# Patient Record
Sex: Male | Born: 2000 | Race: Black or African American | Hispanic: No | Marital: Single | Smoking: Never smoker
Health system: Southern US, Community
[De-identification: ages and names within clinical notes are randomized; demographics above are authoritative.]

## PROBLEM LIST (undated history)

## (undated) DIAGNOSIS — F332 Major depressive disorder, recurrent severe without psychotic features: Secondary | ICD-10-CM

## (undated) HISTORY — PX: FRACTURE SURGERY: SHX138

---
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
## 2022-03-01 ENCOUNTER — Emergency Department (HOSPITAL_COMMUNITY)
Admission: EM | Admit: 2022-03-01 | Discharge: 2022-03-02 | Disposition: A | Discharge status: Home / Self Care | Payer: Self-pay | Attending: Emergency Medicine | Admitting: Emergency Medicine

## 2022-03-01 ENCOUNTER — Emergency Department (HOSPITAL_COMMUNITY): Payer: Self-pay

## 2022-03-01 DIAGNOSIS — F19920 Other psychoactive substance use, unspecified with intoxication, uncomplicated: Secondary | ICD-10-CM

## 2022-03-01 DIAGNOSIS — F12920 Cannabis use, unspecified with intoxication, uncomplicated: Secondary | ICD-10-CM | POA: Insufficient documentation

## 2022-03-01 DIAGNOSIS — R569 Unspecified convulsions: Secondary | ICD-10-CM | POA: Insufficient documentation

## 2022-03-01 LAB — CBC WITH DIFFERENTIAL/PLATELET
Abs Immature Granulocytes: 0.04 10*3/uL (ref 0.00–0.07)
Basophils Absolute: 0.1 10*3/uL (ref 0.0–0.1)
Basophils Relative: 1 %
Eosinophils Absolute: 0.2 10*3/uL (ref 0.0–0.5)
Eosinophils Relative: 3 %
HCT: 42.2 % (ref 39.0–52.0)
Hemoglobin: 14.2 g/dL (ref 13.0–17.0)
Immature Granulocytes: 1 %
Lymphocytes Relative: 11 %
Lymphs Abs: 0.8 10*3/uL (ref 0.7–4.0)
MCH: 30 pg (ref 26.0–34.0)
MCHC: 33.6 g/dL (ref 30.0–36.0)
MCV: 89 fL (ref 80.0–100.0)
Monocytes Absolute: 0.5 10*3/uL (ref 0.1–1.0)
Monocytes Relative: 7 %
Neutro Abs: 5.8 10*3/uL (ref 1.7–7.7)
Neutrophils Relative %: 77 %
Platelets: 255 10*3/uL (ref 150–400)
RBC: 4.74 MIL/uL (ref 4.22–5.81)
RDW: 12.3 % (ref 11.5–15.5)
WBC: 7.4 10*3/uL (ref 4.0–10.5)
nRBC: 0 % (ref 0.0–0.2)

## 2022-03-01 LAB — COMPREHENSIVE METABOLIC PANEL
ALT: 12 U/L (ref 0–44)
AST: 17 U/L (ref 15–41)
Albumin: 3.9 g/dL (ref 3.5–5.0)
Alkaline Phosphatase: 56 U/L (ref 38–126)
Anion gap: 7 (ref 5–15)
BUN: 7 mg/dL (ref 6–20)
CO2: 24 mmol/L (ref 22–32)
Calcium: 8.6 mg/dL — ABNORMAL LOW (ref 8.9–10.3)
Chloride: 103 mmol/L (ref 98–111)
Creatinine, Ser: 1.06 mg/dL (ref 0.61–1.24)
GFR, Estimated: >60 mL/min (ref >60)
Glucose, Bld: 137 mg/dL — ABNORMAL HIGH (ref 70–99)
Potassium: 3.9 mmol/L (ref 3.5–5.1)
Sodium: 134 mmol/L — ABNORMAL LOW (ref 135–145)
Total Bilirubin: 0.6 mg/dL (ref 0.3–1.2)
Total Protein: 6.5 g/dL (ref 6.5–8.1)

## 2022-03-01 LAB — ETHANOL: Alcohol, Ethyl (B): <10 mg/dL (ref <10)

## 2022-03-01 LAB — MAGNESIUM: Magnesium: 2 mg/dL (ref 1.7–2.4)
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
## 2022-03-01 NOTE — ED Notes (Signed)
Patient transported to CT 

## 2022-03-01 NOTE — Discharge Instructions (Signed)
You were seen in the ER after being picked up by an ambulance for a possible seizure.  You told the ambulance crew that you had smoked or used K2, which is a type of street drug which can cause seizures.  It is extremely important to not you ever use this type of drug again.  This would include marijuana which can also trigger seizures.  Your CT scan of the brain your blood work was otherwise normal in the ER.  You should not drive a car or operate heavy machinery until you are seizure-free for 6 months.  If you have another seizure please call 911 and come back to the ER immediately.

## 2022-03-01 NOTE — ED Triage Notes (Signed)
Pt BIB [facility] for seizure/AMS.  Pt was witnessed behaving erratically. Pt was then found on ground shaking and foaming at mouth.  Seizure assumed but pt endorses having smoked K2 prior to event.  EMS states he has appeared postictal en route but cannot confirm seizure hx or whether one definitively happened tonight. Pt was not hypoxic on scene.  No obvious injuries.  Pt is lethargic but arousable on arrival.

## 2022-03-02 LAB — RAPID URINE DRUG SCREEN, HOSP PERFORMED
Amphetamines: NOT DETECTED
Barbiturates: NOT DETECTED
Benzodiazepines: NOT DETECTED
Cocaine: NOT DETECTED
Opiates: NOT DETECTED
Tetrahydrocannabinol: POSITIVE — AB

## 2022-03-02 NOTE — ED Notes (Addendum)
Pt's dad's contact number:  [phone]

## 2022-03-02 NOTE — ED Provider Notes (Signed)
Patient care assumed at 2330.  Patient here for evaluation following a seizure-like activity after using drugs.  Patient observed for several hours overnight and continued to metabolize.  Patient does not recall earlier events.  On assessment patient is awake, alert, not suicidal or homicidal.  He is calm and appropriate.  No focal neurologic deficits.  He is able to ambulate without difficulty.  Discussed with patient findings of studies.  Also contacted his father via phone.  Plan to discharge with outpatient follow-up.   Tilden Fossa, MD 03/02/22 [phone]

## 2022-03-02 NOTE — ED Notes (Signed)
Pt is alert and oriented x 4 Patient ambulated  Attempted to call father x2

## 2022-03-02 NOTE — ED Notes (Signed)
PT ambulated around room with steady gait

## 2022-03-28 ENCOUNTER — Encounter (HOSPITAL_COMMUNITY): Payer: Self-pay | Admitting: *Deleted

## 2022-03-28 ENCOUNTER — Ambulatory Visit (HOSPITAL_COMMUNITY)
Admission: EM | Admit: 2022-03-28 | Discharge: 2022-03-28 | Disposition: A | Discharge status: Home / Self Care | Payer: Self-pay

## 2022-03-28 ENCOUNTER — Other Ambulatory Visit: Payer: Self-pay

## 2022-03-28 DIAGNOSIS — S60464A Insect bite (nonvenomous) of right ring finger, initial encounter: Secondary | ICD-10-CM

## 2022-03-28 DIAGNOSIS — W57XXXA Bitten or stung by nonvenomous insect and other nonvenomous arthropods, initial encounter: Secondary | ICD-10-CM

## 2022-03-28 NOTE — ED Triage Notes (Signed)
PT reports on Thursday he saw the site where a spider bite  was.Pt has a small area on ring finger.
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
## 2022-03-28 NOTE — Discharge Instructions (Signed)
Apply antibiotic ointment such as bacitracin to the area twice daily.  Keep an eye out for any signs of infection. Please go to the emergency department if symptoms worsen.

## 2023-01-30 ENCOUNTER — Emergency Department (HOSPITAL_COMMUNITY): Payer: Self-pay

## 2023-01-30 ENCOUNTER — Inpatient Hospital Stay (HOSPITAL_COMMUNITY)
Admission: EM | Admit: 2023-01-30 | Discharge: 2023-02-02 | DRG: 917 | Discharge status: Against Medical Advice | Payer: Self-pay | Attending: Internal Medicine | Admitting: Internal Medicine

## 2023-01-30 ENCOUNTER — Other Ambulatory Visit: Payer: Self-pay

## 2023-01-30 ENCOUNTER — Encounter (HOSPITAL_COMMUNITY): Payer: Self-pay | Admitting: Emergency Medicine

## 2023-01-30 DIAGNOSIS — G928 Other toxic encephalopathy: Secondary | ICD-10-CM | POA: Diagnosis present

## 2023-01-30 DIAGNOSIS — N179 Acute kidney failure, unspecified: Secondary | ICD-10-CM | POA: Diagnosis present

## 2023-01-30 DIAGNOSIS — F1210 Cannabis abuse, uncomplicated: Secondary | ICD-10-CM | POA: Diagnosis present

## 2023-01-30 DIAGNOSIS — M6282 Rhabdomyolysis: Secondary | ICD-10-CM | POA: Diagnosis present

## 2023-01-30 DIAGNOSIS — B179 Acute viral hepatitis, unspecified: Secondary | ICD-10-CM | POA: Diagnosis present

## 2023-01-30 DIAGNOSIS — R569 Unspecified convulsions: Secondary | ICD-10-CM

## 2023-01-30 DIAGNOSIS — T40604A Poisoning by unspecified narcotics, undetermined, initial encounter: Principal | ICD-10-CM

## 2023-01-30 DIAGNOSIS — Z79899 Other long term (current) drug therapy: Secondary | ICD-10-CM

## 2023-01-30 DIAGNOSIS — E875 Hyperkalemia: Secondary | ICD-10-CM | POA: Diagnosis present

## 2023-01-30 DIAGNOSIS — Z5900 Homelessness unspecified: Secondary | ICD-10-CM

## 2023-01-30 DIAGNOSIS — D72828 Other elevated white blood cell count: Secondary | ICD-10-CM | POA: Diagnosis present

## 2023-01-30 DIAGNOSIS — F1510 Other stimulant abuse, uncomplicated: Secondary | ICD-10-CM | POA: Diagnosis present

## 2023-01-30 DIAGNOSIS — E1165 Type 2 diabetes mellitus with hyperglycemia: Secondary | ICD-10-CM | POA: Diagnosis present

## 2023-01-30 DIAGNOSIS — F1310 Sedative, hypnotic or anxiolytic abuse, uncomplicated: Secondary | ICD-10-CM | POA: Diagnosis present

## 2023-01-30 DIAGNOSIS — F332 Major depressive disorder, recurrent severe without psychotic features: Secondary | ICD-10-CM | POA: Diagnosis present

## 2023-01-30 DIAGNOSIS — R7401 Elevation of levels of liver transaminase levels: Secondary | ICD-10-CM | POA: Insufficient documentation

## 2023-01-30 DIAGNOSIS — G4089 Other seizures: Secondary | ICD-10-CM | POA: Diagnosis present

## 2023-01-30 DIAGNOSIS — F1910 Other psychoactive substance abuse, uncomplicated: Secondary | ICD-10-CM

## 2023-01-30 DIAGNOSIS — T402X4A Poisoning by other opioids, undetermined, initial encounter: Principal | ICD-10-CM | POA: Diagnosis present

## 2023-01-30 DIAGNOSIS — Z5329 Procedure and treatment not carried out because of patient's decision for other reasons: Secondary | ICD-10-CM | POA: Diagnosis present

## 2023-01-30 DIAGNOSIS — R4182 Altered mental status, unspecified: Secondary | ICD-10-CM

## 2023-01-30 LAB — HEPATITIS PANEL, ACUTE
HCV Ab: NONREACTIVE
Hep A IgM: NONREACTIVE
Hep B C IgM: NONREACTIVE
Hepatitis B Surface Ag: NONREACTIVE

## 2023-01-30 LAB — CBC WITH DIFFERENTIAL/PLATELET
Abs Immature Granulocytes: 0.3 10*3/uL — ABNORMAL HIGH (ref 0.00–0.07)
Basophils Absolute: 0.1 10*3/uL (ref 0.0–0.1)
Basophils Relative: 1 %
Eosinophils Absolute: 0.3 10*3/uL (ref 0.0–0.5)
Eosinophils Relative: 2 %
HCT: 47.8 % (ref 39.0–52.0)
Hemoglobin: 15.2 g/dL (ref 13.0–17.0)
Lymphocytes Relative: 36 %
Lymphs Abs: 5.1 10*3/uL — ABNORMAL HIGH (ref 0.7–4.0)
MCH: 29.6 pg (ref 26.0–34.0)
MCHC: 31.8 g/dL (ref 30.0–36.0)
MCV: 93.2 fL (ref 80.0–100.0)
Monocytes Absolute: 0.1 10*3/uL (ref 0.1–1.0)
Monocytes Relative: 1 %
Myelocytes: 2 %
Neutro Abs: 8.2 10*3/uL — ABNORMAL HIGH (ref 1.7–7.7)
Neutrophils Relative %: 58 %
Platelets: 407 10*3/uL — ABNORMAL HIGH (ref 150–400)
RBC: 5.13 MIL/uL (ref 4.22–5.81)
RDW: 12.7 % (ref 11.5–15.5)
WBC: 14.1 10*3/uL — ABNORMAL HIGH (ref 4.0–10.5)
nRBC: 0 % (ref 0.0–0.2)
nRBC: 0 /100 WBC

## 2023-01-30 LAB — RAPID URINE DRUG SCREEN, HOSP PERFORMED
Amphetamines: POSITIVE — AB
Barbiturates: NOT DETECTED
Benzodiazepines: POSITIVE — AB
Cocaine: NOT DETECTED
Opiates: NOT DETECTED
Tetrahydrocannabinol: POSITIVE — AB

## 2023-01-30 LAB — URINALYSIS, ROUTINE W REFLEX MICROSCOPIC
Bilirubin Urine: NEGATIVE
Glucose, UA: 50 mg/dL — AB
Ketones, ur: 5 mg/dL — AB
Leukocytes,Ua: NEGATIVE
Nitrite: NEGATIVE
Protein, ur: 100 mg/dL — AB
Specific Gravity, Urine: 1.03 (ref 1.005–1.030)
pH: 5 (ref 5.0–8.0)

## 2023-01-30 LAB — COMPREHENSIVE METABOLIC PANEL
ALT: 411 U/L — ABNORMAL HIGH (ref 0–44)
AST: 538 U/L — ABNORMAL HIGH (ref 15–41)
Albumin: 4.3 g/dL (ref 3.5–5.0)
Alkaline Phosphatase: 113 U/L (ref 38–126)
Anion gap: 16 — ABNORMAL HIGH (ref 5–15)
BUN: 13 mg/dL (ref 6–20)
CO2: 23 mmol/L (ref 22–32)
Calcium: 9.4 mg/dL (ref 8.9–10.3)
Chloride: 102 mmol/L (ref 98–111)
Creatinine, Ser: 2.05 mg/dL — ABNORMAL HIGH (ref 0.61–1.24)
GFR, Estimated: 46 mL/min — ABNORMAL LOW (ref >60)
Glucose, Bld: 196 mg/dL — ABNORMAL HIGH (ref 70–99)
Potassium: 5.5 mmol/L — ABNORMAL HIGH (ref 3.5–5.1)
Sodium: 141 mmol/L (ref 135–145)
Total Bilirubin: 0.4 mg/dL (ref 0.3–1.2)
Total Protein: 7.4 g/dL (ref 6.5–8.1)

## 2023-01-30 LAB — I-STAT CHEM 8, ED
BUN: 16 mg/dL (ref 6–20)
Calcium, Ion: 1.26 mmol/L (ref 1.15–1.40)
Chloride: 104 mmol/L (ref 98–111)
Creatinine, Ser: 1.9 mg/dL — ABNORMAL HIGH (ref 0.61–1.24)
Glucose, Bld: 166 mg/dL — ABNORMAL HIGH (ref 70–99)
HCT: 50 % (ref 39.0–52.0)
Hemoglobin: 17 g/dL (ref 13.0–17.0)
Potassium: 5 mmol/L (ref 3.5–5.1)
Sodium: 146 mmol/L — ABNORMAL HIGH (ref 135–145)
TCO2: 26 mmol/L (ref 22–32)

## 2023-01-30 LAB — CBG MONITORING, ED: Glucose-Capillary: 177 mg/dL — ABNORMAL HIGH (ref 70–99)

## 2023-01-30 LAB — SALICYLATE LEVEL: Salicylate Lvl: <7 mg/dL — ABNORMAL LOW (ref 7.0–30.0)

## 2023-01-30 LAB — PROTIME-INR
INR: 1.4 — ABNORMAL HIGH (ref 0.8–1.2)
Prothrombin Time: 16.9 seconds — ABNORMAL HIGH (ref 11.4–15.2)

## 2023-01-30 LAB — ETHANOL: Alcohol, Ethyl (B): <10 mg/dL (ref <10)

## 2023-01-30 LAB — HIV ANTIBODY (ROUTINE TESTING W REFLEX): HIV Screen 4th Generation wRfx: NONREACTIVE

## 2023-01-30 LAB — AMMONIA: Ammonia: 28 umol/L (ref 9–35)

## 2023-01-30 LAB — ACETAMINOPHEN LEVEL: Acetaminophen (Tylenol), Serum: <10 ug/mL — ABNORMAL LOW (ref 10–30)

## 2023-01-30 LAB — MAGNESIUM: Magnesium: 3.4 mg/dL — ABNORMAL HIGH (ref 1.7–2.4)

## 2023-01-30 LAB — VITAMIN B12: Vitamin B-12: 911 pg/mL (ref 180–914)

## 2023-01-30 LAB — CK: Total CK: 569 U/L — ABNORMAL HIGH (ref 49–397)

## 2023-01-30 MED ORDER — ONDANSETRON HCL 4 MG/2ML IJ SOLN
INTRAMUSCULAR | Status: AC
  Administered 2023-01-30: 4 mg via INTRAVENOUS
  Filled 2023-01-30: qty 2

## 2023-01-30 MED ORDER — OXYCODONE HCL 5 MG PO TABS
5.0000 mg | ORAL_TABLET | ORAL | Status: DC | PRN
  Administered 2023-01-30 – 2023-02-02 (×6): 5 mg via ORAL
  Filled 2023-01-30 (×6): qty 1

## 2023-01-30 MED ORDER — GADOBUTROL 1 MMOL/ML IV SOLN
6.0000 mL | Freq: Once | INTRAVENOUS | Status: AC | PRN
  Administered 2023-01-30: 6 mL via INTRAVENOUS

## 2023-01-30 MED ORDER — SODIUM CHLORIDE 0.9 % IV BOLUS
1000.0000 mL | Freq: Once | INTRAVENOUS | Status: AC
  Administered 2023-01-30: 1000 mL via INTRAVENOUS

## 2023-01-30 MED ORDER — LEVETIRACETAM IN NACL 1500 MG/100ML IV SOLN
1500.0000 mg | Freq: Once | INTRAVENOUS | Status: AC
  Administered 2023-01-30: 1500 mg via INTRAVENOUS
  Filled 2023-01-30: qty 100

## 2023-01-30 MED ORDER — KETOROLAC TROMETHAMINE 15 MG/ML IJ SOLN
15.0000 mg | Freq: Four times a day (QID) | INTRAMUSCULAR | Status: DC | PRN
  Filled 2023-01-30: qty 1

## 2023-01-30 MED ORDER — ORAL CARE MOUTH RINSE
15.0000 mL | OROMUCOSAL | Status: DC
  Administered 2023-01-30: 15 mL via OROMUCOSAL

## 2023-01-30 MED ORDER — ORAL CARE MOUTH RINSE: 15.0000 mL | OROMUCOSAL | Status: DC | PRN

## 2023-01-30 MED ORDER — SODIUM CHLORIDE 0.9 % IV SOLN: INTRAVENOUS | Status: AC

## 2023-01-30 MED ORDER — NALOXONE HCL 0.4 MG/ML IJ SOLN
0.4000 mg | Freq: Once | INTRAMUSCULAR | Status: AC
  Administered 2023-01-30: 0.4 mg via INTRAVENOUS
  Filled 2023-01-30: qty 1

## 2023-01-30 MED ORDER — MIDAZOLAM HCL (PF) 10 MG/2ML IJ SOLN
5.0000 mg | Freq: Once | INTRAMUSCULAR | Status: AC
  Administered 2023-01-30: 5 mg via INTRAMUSCULAR
  Filled 2023-01-30: qty 2

## 2023-01-30 MED ORDER — NALOXONE HCL 2 MG/2ML IJ SOSY: 2.0000 mg | PREFILLED_SYRINGE | Freq: Once | INTRAMUSCULAR | Status: AC

## 2023-01-30 MED ORDER — ONDANSETRON HCL 4 MG/2ML IJ SOLN
4.0000 mg | Freq: Once | INTRAMUSCULAR | Status: AC
  Administered 2023-01-30: 4 mg via INTRAVENOUS
  Filled 2023-01-30: qty 2

## 2023-01-30 MED ORDER — ENOXAPARIN SODIUM 40 MG/0.4ML IJ SOSY
40.0000 mg | PREFILLED_SYRINGE | INTRAMUSCULAR | Status: DC
  Administered 2023-01-30 – 2023-01-31 (×2): 40 mg via SUBCUTANEOUS
  Filled 2023-01-30 (×2): qty 0.4

## 2023-01-30 MED ORDER — IOHEXOL 350 MG/ML SOLN
75.0000 mL | Freq: Once | INTRAVENOUS | Status: AC | PRN
  Administered 2023-01-30: 75 mL via INTRAVENOUS

## 2023-01-30 MED ORDER — NALOXONE HCL 2 MG/2ML IJ SOSY
PREFILLED_SYRINGE | INTRAMUSCULAR | Status: AC
  Administered 2023-01-30: 2 mg via INTRAVENOUS
  Filled 2023-01-30: qty 2

## 2023-01-30 MED ORDER — ONDANSETRON HCL 4 MG/2ML IJ SOLN: 4.0000 mg | Freq: Once | INTRAMUSCULAR | Status: AC
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
## 2023-01-30 NOTE — ED Triage Notes (Signed)
Pt arrives to room from triage unresponsive, ventilations assisted. According to other staff, pt was found unresponsive by friends. Unknown hx or precipitating events. RN and MD at bedside upon arrival to room.

## 2023-01-30 NOTE — ED Notes (Signed)
Pt transported to MRI 
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
## 2023-01-30 NOTE — Plan of Care (Signed)
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
## 2023-01-30 NOTE — ED Provider Notes (Signed)
  Physical Exam  BP 110/86 (BP Location: Right Arm)   Pulse 76   Temp 98.4 F (36.9 C) (Oral)   Resp 18   Ht 5\' 8"  (1.727 m)   Wt 63.5 kg   SpO2 100%   BMI 21.29 kg/m   Physical Exam  Procedures  Procedures  ED Course / MDM   Clinical Course as of 01/30/23 1952  Sat Jan 30, 2023  0515 Pt more calm at this time, no ongoing seizure activity, appears post - ictal. Concern for status as pt not returned to baseline, sonorous respirations but will awake to stimulus [SG]  0546 Creatinine(!): 2.05 Baseline cr around 1.0, continue IVF [SG]  0546 CK Total(!): 569 C/w seizure  [SG]  0622 He will open eyes to voice but not follow commands, will does not appear to have ongoing seizure activity but still not back to baseline or following commands, airway intact. Will give rpt keppra [SG]  [id number] D/w neuro, recommends checking INR and Mg [SG]  0700 Assumed care from Dr Wallace Cullens. 22 yo M with hx of seizures, diabetes, polysubstance abuse, major depressive disorder who presented with provoked seizure and opiate overdose. 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 Seized after narcan which she had good response to. Still not back to baseline. Discussed with neuro and will metabolize. Got 2 L fluids for AKI and hyperK.  [RP]  [phone] Dr Iver Nestle from neurology has ordered a spot EEG and MRI.  On exam patient still has saccadic eye movements when attempting to track.  He is not speaking and does not follow commands.  He is withdrawing his right lower extremity and left arm to pain. [RP]  9604 Became nauseous and vomited.  [RP]  1045 Dr Wilford Corner from neurology has evaluated the patient.  Does not feel that antiepileptics are warranted at this time since patient seizures have only been due to provoked seizures from drug use. [RP]  1434 EEG without seizures.  MRI unremarkable.  Patient back to baseline at this time.  Dr Chipper Herb from hospitalist to admit for AKI, hyperkalemia, transaminitis, and very mild  rhabdo. [RP]    Clinical Course User Index [RP] Rondel Baton, MD [SG] Sloan Leiter, DO   Medical Decision Making Amount and/or Complexity of Data Reviewed Labs: ordered. Decision-making details documented in ED Course. Radiology: ordered.  Risk Prescription drug management. Decision regarding hospitalization.     Rondel Baton, MD 01/30/23 [phone]

## 2023-01-30 NOTE — ED Notes (Signed)
Dr. Zhang at bedside.  

## 2023-01-30 NOTE — ED Notes (Signed)
Pt sat straight up during EEG and projectile vomited all over himself and the bed. This RN cleaned the patient up and changed his gown and sheets. This RN spoke with EDP who ordered zofran. Will continue to monitor.

## 2023-01-30 NOTE — ED Notes (Signed)
Pt resting with eyes closed; respirations spontaneous, even, unlabored 

## 2023-01-30 NOTE — Plan of Care (Signed)
Stat EEG is normal.  No evidence of seizures. MRI brain-no acute changes. No further recommendations from an inpatient neurological perspective. Patient be admitted for AKI and liver function abnormality correction. Plan discussed with Dr. Jarold Motto Please call neurology with questions as needed  -- Milon Dikes, MD Neurologist [facility] Pager: [phone]

## 2023-01-30 NOTE — ED Notes (Signed)
Pt alert, talking on phone

## 2023-01-30 NOTE — Procedures (Signed)
Patient Name: Christopher Doyle  MRN: [id number]  Epilepsy Attending: Charlsie Quest  Referring Physician/Provider: Milon Dikes, MD  Date: 01/30/2023 Duration: 30.52 mins  Patient history: 22 year old presenting with obtundation, seizure-like activity following Narcan, now remaining persistently altered. EEG to evaluate for seizure  Level of alertness: Awake, asleep  AEDs during EEG study: LEV versed  Technical aspects: This EEG study was done with scalp electrodes positioned according to the 10-20 International system of electrode placement. Electrical activity was reviewed with band pass filter of 1-70Hz , sensitivity of 7 uV/mm, display speed of 18mm/sec with a 60Hz  notched filter applied as appropriate. EEG data were recorded continuously and digitally stored.  Video monitoring was available and reviewed as appropriate.  Description: The posterior dominant rhythm consists of 9 Hz activity of moderate voltage (25-35 uV) seen predominantly in posterior head regions, symmetric and reactive to eye opening and eye closing. Sleep was characterized by vertex waves, sleep spindles (12 to 14 Hz), maximal frontocentral region. Photic driving was not seen during photic stimulation. Hyperventilation was not performed.     IMPRESSION: This study is within normal limits. No seizures or epileptiform discharges were seen throughout the recording.  A normal interictal EEG does not exclude the diagnosis of epilepsy.  Kellyanne Ellwanger Annabelle Harman
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
## 2023-01-30 NOTE — Progress Notes (Signed)
EEG complete - results pending 
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
## 2023-01-31 DIAGNOSIS — R7401 Elevation of levels of liver transaminase levels: Secondary | ICD-10-CM

## 2023-01-31 DIAGNOSIS — R4182 Altered mental status, unspecified: Secondary | ICD-10-CM

## 2023-01-31 DIAGNOSIS — F1910 Other psychoactive substance abuse, uncomplicated: Secondary | ICD-10-CM

## 2023-01-31 DIAGNOSIS — N179 Acute kidney failure, unspecified: Secondary | ICD-10-CM

## 2023-01-31 LAB — RPR: RPR Ser Ql: NONREACTIVE

## 2023-01-31 LAB — COMPREHENSIVE METABOLIC PANEL
ALT: 345 U/L — ABNORMAL HIGH (ref 0–44)
AST: 900 U/L — ABNORMAL HIGH (ref 15–41)
Albumin: 3.4 g/dL — ABNORMAL LOW (ref 3.5–5.0)
Alkaline Phosphatase: 74 U/L (ref 38–126)
Anion gap: 10 (ref 5–15)
BUN: <5 mg/dL — ABNORMAL LOW (ref 6–20)
CO2: 23 mmol/L (ref 22–32)
Calcium: 8.4 mg/dL — ABNORMAL LOW (ref 8.9–10.3)
Chloride: 102 mmol/L (ref 98–111)
Creatinine, Ser: 1.03 mg/dL (ref 0.61–1.24)
GFR, Estimated: >60 mL/min (ref >60)
Glucose, Bld: 92 mg/dL (ref 70–99)
Potassium: 3.8 mmol/L (ref 3.5–5.1)
Sodium: 135 mmol/L (ref 135–145)
Total Bilirubin: 0.3 mg/dL (ref 0.3–1.2)
Total Protein: 6.2 g/dL — ABNORMAL LOW (ref 6.5–8.1)

## 2023-01-31 LAB — PROTIME-INR
INR: 1.3 — ABNORMAL HIGH (ref 0.8–1.2)
Prothrombin Time: 15.6 seconds — ABNORMAL HIGH (ref 11.4–15.2)

## 2023-01-31 LAB — CK: Total CK: 36856 U/L — ABNORMAL HIGH (ref 49–397)

## 2023-01-31 MED ORDER — ONDANSETRON HCL 4 MG/2ML IJ SOLN: 4.0000 mg | Freq: Four times a day (QID) | INTRAMUSCULAR | Status: DC | PRN

## 2023-01-31 MED ORDER — ACETAMINOPHEN 325 MG PO TABS: 650.0000 mg | ORAL_TABLET | ORAL | Status: DC | PRN

## 2023-01-31 MED ORDER — MORPHINE SULFATE (PF) 2 MG/ML IV SOLN
1.0000 mg | INTRAVENOUS | Status: DC | PRN
  Administered 2023-02-01: 2 mg via INTRAVENOUS
  Filled 2023-01-31: qty 1

## 2023-01-31 NOTE — Progress Notes (Signed)
[facility]                                                                               Christopher Doyle, is a 22 y.o. male, DOB - 2000-11-04, ZOX:[id number] Admit date - 01/30/2023    Outpatient Primary MD for the patient is [place], O'Laf, PA-C  LOS - 0  days    Brief summary    Christopher Doyle is a 22 y.o. male with medical history significant of anxiety/depression presented with altered mentation.   Patient was found unresponsive, by his roommate this morning who called EMS.  After patient arrived in the ED, he was given 2 mg of IV Narcan initially, then patient responded breaking up and soon he went into a tonic-clonic seizure and patient was loaded with Keppra and Versed.   CT head negative for acute findings, CT abdomen pelvis showed periportal swelling of the liver suspicious for acute hepatitis. Blood work showed AKI creatinine 2.0, elevated AST and ALT and slight elevation of INR.    Assessment & Plan    Assessment and Plan:   Acute encephalopathy suspect from polysubstance abuse Pt this morning is alert and oriented.  No signs of withdrawal at this time.    Acute severe Rhabdomyolysis:  CK levels around 36,856.  Continue with IV fluids.  Check CK daily.     Witness tonic clonic seizures:  Resolved with versed and keppra.  Neurology consulted and recommendations given.  No indication for maintenance anti seizure medications.  MRI and EEG is wnl.   AKI with hyperkalemia:  Pre renal,  Renal parameters improving with Fluids    Hyperkalemia Resolved.     Acute hepatitis of unclear etiology.  ? Substance abuse.  Acute hepatitis panel is negative.  Monitor liver enzymes.  Pt denies any nausea, vomiting or abd pain.  CT abd shows perihepatic edema consistent with acute hepatitis.     Left buttock and hip pain:  Suspect from rhabdomyolysis.   Polysubstance abuse  UDS is positive for amphetamines, THC, AND benzodiazepines.    Leukocytosis:  Suspect reactive from rhabdo.  Monitor and check in am.    Estimated body mass index is 21.29 kg/m as calculated from the following:   Height as of this encounter: 5\' 8"  (1.727 m).   Weight as of this encounter: 63.5 kg.  Code Status: full code.  DVT Prophylaxis:  enoxaparin (LOVENOX) injection 40 mg Start: 01/30/23 1500   Level of Care: Level of care: Telemetry Medical Family Communication: none at bedside.   Disposition Plan:     Remains inpatient appropriate: elevated CK. IV fluids.   Procedures:  EEG This study is within normal limits. No seizures or epileptiform discharges were seen throughout the recording.   Consultants:   Neurology.   Antimicrobials:   Anti-infectives (From admission, onward)    None        Medications  Scheduled Meds:  enoxaparin (LOVENOX) injection  40 mg Subcutaneous Q24H   Continuous Infusions:  sodium chloride Stopped (01/31/23 1333)   PRN Meds:.acetaminophen, morphine injection, ondansetron (ZOFRAN) IV, mouth rinse, oxyCODONE    Subjective:   Christopher Doyle was seen and examined today.  Severe pain  in the left buttock.   Objective:   Vitals:   01/30/23 2028 01/31/23 0023 01/31/23 0421 01/31/23 1011  BP: (!) 149/78 105/63 (!) 112/53 (!) 126/91  Pulse: 65 62 (!) 58 78  Resp: 20 18 20 18   Temp: 98.1 F (36.7 C) 99.3 F (37.4 C) 98.3 F (36.8 C) 99.1 F (37.3 C)  TempSrc: Oral Oral Oral Oral  SpO2: 98% 99% 99% 100%  Weight:      Height:        Intake/Output Summary (Last 24 hours) at 01/31/2023 1458 Last data filed at 01/31/2023 0500 Gross per 24 hour  Intake 800 ml  Output 1250 ml  Net -450 ml   Filed Weights   01/30/23 0423  Weight: 63.5 kg     Exam General exam: Appears calm and comfortable  Respiratory system: Clear to auscultation. Respiratory effort normal. Cardiovascular system: S1 & S2 heard, RRR. No JVD, murmurs, Gastrointestinal system: Abdomen is nondistended, soft and nontender.   Central nervous system: Alert and oriented.  Extremities: severe left buttock pain. No pedal edema. No signs of compartment syndrome.  Skin: No rashes,  Psychiatry:  Mood & affect appropriate.     Data Reviewed:  I have personally reviewed following labs and imaging studies   CBC Lab Results  Component Value Date   WBC 14.1 (H) 01/30/2023   RBC 5.13 01/30/2023   HGB 17.0 01/30/2023   HCT 50.0 01/30/2023   MCV 93.2 01/30/2023   MCH 29.6 01/30/2023   PLT 407 (H) 01/30/2023   MCHC 31.8 01/30/2023   RDW 12.7 01/30/2023   LYMPHSABS 5.1 (H) 01/30/2023   MONOABS 0.1 01/30/2023   EOSABS 0.3 01/30/2023   BASOSABS 0.1 01/30/2023     Last metabolic panel Lab Results  Component Value Date   NA 135 01/31/2023   K 3.8 01/31/2023   CL 102 01/31/2023   CO2 23 01/31/2023   BUN <5 (L) 01/31/2023   CREATININE 1.03 01/31/2023   GLUCOSE 92 01/31/2023   GFRNONAA >60 01/31/2023   GFRAA NOT CALCULATED 06/19/2017   CALCIUM 8.4 (L) 01/31/2023   PROT 6.2 (L) 01/31/2023   ALBUMIN 3.4 (L) 01/31/2023   BILITOT 0.3 01/31/2023   ALKPHOS 74 01/31/2023   AST 900 (H) 01/31/2023   ALT 345 (H) 01/31/2023   ANIONGAP 10 01/31/2023    CBG (last 3)  Recent Labs    01/30/23 0434  GLUCAP 177*      Coagulation Profile: Recent Labs  Lab 01/30/23 0653 01/31/23 0811  INR 1.4* 1.3*     Radiology Studies: MR BRAIN W WO CONTRAST  Result Date: 01/30/2023 CLINICAL DATA:  Delirium. EXAM: MRI HEAD WITHOUT AND WITH CONTRAST TECHNIQUE: Multiplanar, multiecho pulse sequences of the brain and surrounding structures were obtained without and with intravenous contrast. CONTRAST:  6mL GADAVIST GADOBUTROL 1 MMOL/ML IV SOLN COMPARISON:  Head CT 01/30/2023. FINDINGS: Brain: No acute infarct or hemorrhage. No hydrocephalus or extra-axial collection. No foci of abnormal susceptibility. No mass or abnormal enhancement. Vascular: Normal flow voids and enhancement. Skull and upper cervical spine: Normal marrow  signal and enhancement. Sinuses/Orbits: Unremarkable. Other: None. IMPRESSION: Normal brain MRI. Electronically Signed   By: Orvan Falconer M.D.   On: 01/30/2023 11:18   EEG adult  Result Date: 01/30/2023 Charlsie Quest, MD     01/30/2023  8:51 AM Patient Name: Christopher Doyle MRN: [id number] Epilepsy Attending: Charlsie Quest Referring Physician/Provider: Milon Dikes, MD Date: 01/30/2023 Duration: 30.52 mins Patient history: 22 year old presenting with obtundation,  seizure-like activity following Narcan, now remaining persistently altered. EEG to evaluate for seizure Level of alertness: Awake, asleep AEDs during EEG study: LEV versed Technical aspects: This EEG study was done with scalp electrodes positioned according to the 10-20 International system of electrode placement. Electrical activity was reviewed with band pass filter of 1-70Hz , sensitivity of 7 uV/mm, display speed of 47mm/sec with a 60Hz  notched filter applied as appropriate. EEG data were recorded continuously and digitally stored.  Video monitoring was available and reviewed as appropriate. Description: The posterior dominant rhythm consists of 9 Hz activity of moderate voltage (25-35 uV) seen predominantly in posterior head regions, symmetric and reactive to eye opening and eye closing. Sleep was characterized by vertex waves, sleep spindles (12 to 14 Hz), maximal frontocentral region. Photic driving was not seen during photic stimulation. Hyperventilation was not performed.   IMPRESSION: This study is within normal limits. No seizures or epileptiform discharges were seen throughout the recording. A normal interictal EEG does not exclude the diagnosis of epilepsy. Charlsie Quest   CT ABDOMEN PELVIS W CONTRAST  Result Date: 01/30/2023 CLINICAL DATA:  22 year old male with history of acute onset of abdominal pain. Abnormal liver function tests. EXAM: CT ABDOMEN AND PELVIS WITH CONTRAST TECHNIQUE: Multidetector CT imaging of the abdomen and  pelvis was performed using the standard protocol following bolus administration of intravenous contrast. RADIATION DOSE REDUCTION: This exam was performed according to the departmental dose-optimization program which includes automated exposure control, adjustment of the mA and/or kV according to patient size and/or use of iterative reconstruction technique. CONTRAST:  75mL OMNIPAQUE IOHEXOL 350 MG/ML SOLN COMPARISON:  No priors. FINDINGS: Lower chest: Unremarkable. Hepatobiliary: Diffuse periportal edema in the liver. In segment 8 of the liver (axial image 10 of series 3 and coronal image 43 of series 6) there is a 1.5 x 1.6 x 1.3 cm lesion which is mixed attenuation, with internal areas of low attenuation and apparent enhancement, considered indeterminate and incompletely characterized on today's CT examination. No intra or extrahepatic biliary ductal dilatation. Gallbladder is unremarkable in appearance. Pancreas: No pancreatic mass. No pancreatic ductal dilatation. No pancreatic or peripancreatic fluid collections or inflammatory changes. Spleen: Unremarkable. Adrenals/Urinary Tract: Bilateral kidneys and bilateral adrenal glands are normal in appearance. No hydroureteronephrosis. Urinary bladder is unremarkable in appearance. Stomach/Bowel: The appearance of the stomach is normal. No pathologic dilatation of small bowel or colon. Normal appendix. Vascular/Lymphatic: No significant atherosclerotic disease, aneurysm or dissection noted in the abdominal or pelvic vasculature. No lymphadenopathy noted in the abdomen or pelvis. Reproductive: Prostate gland and seminal vesicles are unremarkable in appearance. Other: No significant volume of ascites.  No pneumoperitoneum. Musculoskeletal: There are no aggressive appearing lytic or blastic lesions noted in the visualized portions of the skeleton. IMPRESSION: 1. Severe diffuse periportal edema in the liver. This is a nonspecific finding, but can be seen in the setting  of acute hepatitis. 2. Indeterminate lesion in segment 8 of the liver. This is favored to represent a benign lesions such as a cavernous hemangioma, and would require follow-up nonemergent outpatient abdominal MRI with and without IV gadolinium for definitive characterization. Electronically Signed   By: Trudie Reed M.D.   On: 01/30/2023 06:34   DG Chest Portable 1 View  Result Date: 01/30/2023 CLINICAL DATA:  22 year old male with history of altered mental status. EXAM: PORTABLE CHEST 1 VIEW COMPARISON:  No priors. FINDINGS: Lung volumes are normal. No consolidative airspace disease. No pleural effusions. No pneumothorax. No pulmonary nodule or mass noted. Pulmonary  vasculature and the cardiomediastinal silhouette are within normal limits. IMPRESSION: No radiographic evidence of acute cardiopulmonary disease. Electronically Signed   By: Trudie Reed M.D.   On: 01/30/2023 06:02   CT Head Wo Contrast  Result Date: 01/30/2023 CLINICAL DATA:  22 year old male with history of delirium. EXAM: CT HEAD WITHOUT CONTRAST TECHNIQUE: Contiguous axial images were obtained from the base of the skull through the vertex without intravenous contrast. RADIATION DOSE REDUCTION: This exam was performed according to the departmental dose-optimization program which includes automated exposure control, adjustment of the mA and/or kV according to patient size and/or use of iterative reconstruction technique. COMPARISON:  Head CT 03/01/2022. FINDINGS: Brain: No evidence of acute infarction, hemorrhage, hydrocephalus, extra-axial collection or mass lesion/mass effect. Vascular: No hyperdense vessel or unexpected calcification. Skull: Normal. Negative for fracture or focal lesion. Sinuses/Orbits: No acute finding. Mild multifocal mucosal thickening in the ethmoid sinuses bilaterally and maxillary sinuses bilaterally. Other: None. IMPRESSION: 1. No acute intracranial abnormalities. The appearance of the brain is normal.  Electronically Signed   By: Trudie Reed M.D.   On: 01/30/2023 05:32       Kathlen Mody M.D. [facility] 01/31/2023, 2:58 PM  Available via Epic secure chat 7am-7pm After 7 pm, please refer to night coverage provider listed on amion.
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
## 2023-01-31 NOTE — Progress Notes (Signed)
Inpatient Rehab Admissions Coordinator Note:   Per PT patient was screened for CIR candidacy by Ryenn Howeth Luvenia Starch, CCC-SLP. At this time, pt appears to be a potential candidate for CIR. I will place an order for rehab consult for full assessment, per our protocol.  Please contact me any with questions.Wolfgang Phoenix, MS, CCC-SLP Admissions Coordinator [phone] 01/31/23 5:46 PM

## 2023-01-31 NOTE — Progress Notes (Signed)
Patient states he took a crushed up pill of  "x" before he had seizure at home. Educated patient and his father regarding possible cause of seizure. Patient able to teach back he has AKI, liver inflammation, and had a seizure and he needs to drink plenty of water and avoid alcohol, drugs, and dark soda. Ice pack placed on left buttock and oxycodone given for pain, with good relief. Notified on call MD with new onset numbness and weakness in LLE and will report in hand off to day shift nurse in am.  Oliver Barre, RN

## 2023-02-01 LAB — HEPATIC FUNCTION PANEL
ALT: 294 U/L — ABNORMAL HIGH (ref 0–44)
AST: 710 U/L — ABNORMAL HIGH (ref 15–41)
Albumin: 3.2 g/dL — ABNORMAL LOW (ref 3.5–5.0)
Alkaline Phosphatase: 70 U/L (ref 38–126)
Bilirubin, Direct: 0.2 mg/dL (ref 0.0–0.2)
Indirect Bilirubin: 0.6 mg/dL (ref 0.3–0.9)
Total Bilirubin: 0.8 mg/dL (ref 0.3–1.2)
Total Protein: 6.2 g/dL — ABNORMAL LOW (ref 6.5–8.1)

## 2023-02-01 LAB — CBC WITH DIFFERENTIAL/PLATELET
Abs Immature Granulocytes: 0.04 10*3/uL (ref 0.00–0.07)
Basophils Absolute: 0.1 10*3/uL (ref 0.0–0.1)
Basophils Relative: 1 %
Eosinophils Absolute: 0.2 10*3/uL (ref 0.0–0.5)
Eosinophils Relative: 2 %
HCT: 44.7 % (ref 39.0–52.0)
Hemoglobin: 15.7 g/dL (ref 13.0–17.0)
Immature Granulocytes: 1 %
Lymphocytes Relative: 18 %
Lymphs Abs: 1.5 10*3/uL (ref 0.7–4.0)
MCH: 30.5 pg (ref 26.0–34.0)
MCHC: 35.1 g/dL (ref 30.0–36.0)
MCV: 87 fL (ref 80.0–100.0)
Monocytes Absolute: 0.7 10*3/uL (ref 0.1–1.0)
Monocytes Relative: 8 %
Neutro Abs: 5.9 10*3/uL (ref 1.7–7.7)
Neutrophils Relative %: 70 %
Platelets: 354 10*3/uL (ref 150–400)
RBC: 5.14 MIL/uL (ref 4.22–5.81)
RDW: 12.3 % (ref 11.5–15.5)
WBC: 8.4 10*3/uL (ref 4.0–10.5)
nRBC: 0 % (ref 0.0–0.2)

## 2023-02-01 LAB — BASIC METABOLIC PANEL
Anion gap: 8 (ref 5–15)
BUN: 5 mg/dL — ABNORMAL LOW (ref 6–20)
CO2: 24 mmol/L (ref 22–32)
Calcium: 9 mg/dL (ref 8.9–10.3)
Chloride: 102 mmol/L (ref 98–111)
Creatinine, Ser: 0.82 mg/dL (ref 0.61–1.24)
GFR, Estimated: >60 mL/min (ref >60)
Glucose, Bld: 92 mg/dL (ref 70–99)
Potassium: 4.1 mmol/L (ref 3.5–5.1)
Sodium: 134 mmol/L — ABNORMAL LOW (ref 135–145)

## 2023-02-01 LAB — CK: Total CK: 21064 U/L — ABNORMAL HIGH (ref 49–397)

## 2023-02-01 LAB — MAGNESIUM: Magnesium: 1.8 mg/dL (ref 1.7–2.4)

## 2023-02-01 MED ORDER — SODIUM CHLORIDE 0.9 % IV SOLN: INTRAVENOUS | Status: DC
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
## 2023-02-01 NOTE — Progress Notes (Signed)
[facility]                                                                               Christopher Doyle, is a 22 y.o. male, DOB - 2001-01-29, WUJ:[id number] Admit date - 01/30/2023    Outpatient Primary MD for the patient is Mass[place], O'Laf, PA-C  LOS - 1  days    Brief summary    Christopher Doyle is a 22 y.o. male with medical history significant of anxiety/depression presented with altered mentation.   Patient was found unresponsive, by his roommate this morning who called EMS.  After patient arrived in the ED, he was given 2 mg of IV Narcan initially, then patient responded breaking up and soon he went into a tonic-clonic seizure and patient was loaded with Keppra and Versed.   CT head negative for acute findings, CT abdomen pelvis showed periportal swelling of the liver suspicious for acute hepatitis. Blood work showed AKI creatinine 2.0, elevated AST and ALT and slight elevation of INR.  He was admitted for acute RHABDOMYOLYSIS AND AKI.    Assessment & Plan    Assessment and Plan:   Acute encephalopathy suspect from polysubstance abuse Resolved.  No signs of withdrawal at this time.    Acute severe Rhabdomyolysis:  CK levels around 36,856, improved to [address] Continue with IV fluids.  Check CK daily.  Therapy eval recommending CIR.     Witness tonic clonic seizures:  Resolved with versed and keppra.  Neurology consulted and recommendations given.  No indication for maintenance anti seizure medications.  MRI and EEG is wnl.   AKI with hyperkalemia:  Pre renal,  Renal parameters improving with Fluids    Hyperkalemia Resolved.     Acute hepatitis of unclear etiology.  ? Substance abuse.  Acute hepatitis panel is negative.  Monitor liver enzymes.  Pt denies any nausea, vomiting or abd pain.  CT abd shows perihepatic edema consistent with acute hepatitis.     Left buttock and hip pain:  Suspect from rhabdomyolysis. Improving.   Polysubstance  abuse  UDS is positive for amphetamines, THC, AND benzodiazepines.   Leukocytosis:  Suspect reactive from rhabdo.  Resolved.    Estimated body mass index is 21.29 kg/m as calculated from the following:   Height as of this encounter: 5\' 8"  (1.727 m).   Weight as of this encounter: 63.5 kg.  Code Status: full code.  DVT Prophylaxis:  enoxaparin (LOVENOX) injection 40 mg Start: 01/30/23 1500   Level of Care: Level of care: Telemetry Medical Family Communication: none at bedside.   Disposition Plan:     Remains inpatient appropriate: elevated CK. IV fluids.   Procedures:  EEG This study is within normal limits. No seizures or epileptiform discharges were seen throughout the recording.   Consultants:   Neurology.   Antimicrobials:   Anti-infectives (From admission, onward)    None        Medications  Scheduled Meds:  enoxaparin (LOVENOX) injection  40 mg Subcutaneous Q24H   Continuous Infusions:  sodium chloride 125 mL/hr at 02/01/23 1112   PRN Meds:.acetaminophen, morphine injection, ondansetron (ZOFRAN) IV, mouth rinse, oxyCODONE    Subjective:   Christopher Heck  Doyle was seen and examined today.  Pain is improving.   Objective:   Vitals:   01/31/23 2356 02/01/23 0610 02/01/23 0841 02/01/23 1113  BP: 111/70 119/70 137/85 123/74  Pulse: 71 60 73 64  Resp: 16 16 18 18   Temp: 99.4 F (37.4 C) 99 F (37.2 C) 98.1 F (36.7 C) 98.4 F (36.9 C)  TempSrc: Oral Oral Oral Oral  SpO2: 95% 97% 97% 100%  Weight:      Height:        Intake/Output Summary (Last 24 hours) at 02/01/2023 1214 Last data filed at 02/01/2023 0600 Gross per 24 hour  Intake 943.96 ml  Output 2850 ml  Net -1906.04 ml    Filed Weights   01/30/23 0423  Weight: 63.5 kg     Exam General exam: Appears calm and comfortable  Respiratory system: Clear to auscultation. Respiratory effort normal. Cardiovascular system: S1 & S2 heard, RRR. No JVD,  Gastrointestinal system: Abdomen is  nondistended, soft and nontender.  Central nervous system: Alert and oriented. No focal neurological deficits. Extremities: Symmetric 5 x 5 power. Skin: No rashes,  Psychiatry:  Mood & affect appropriate.      Data Reviewed:  I have personally reviewed following labs and imaging studies   CBC Lab Results  Component Value Date   WBC 8.4 02/01/2023   RBC 5.14 02/01/2023   HGB 15.7 02/01/2023   HCT 44.7 02/01/2023   MCV 87.0 02/01/2023   MCH 30.5 02/01/2023   PLT 354 02/01/2023   MCHC 35.1 02/01/2023   RDW 12.3 02/01/2023   LYMPHSABS 1.5 02/01/2023   MONOABS 0.7 02/01/2023   EOSABS 0.2 02/01/2023   BASOSABS 0.1 02/01/2023     Last metabolic panel Lab Results  Component Value Date   NA 134 (L) 02/01/2023   K 4.1 02/01/2023   CL 102 02/01/2023   CO2 24 02/01/2023   BUN 5 (L) 02/01/2023   CREATININE 0.82 02/01/2023   GLUCOSE 92 02/01/2023   GFRNONAA >60 02/01/2023   GFRAA NOT CALCULATED 06/19/2017   CALCIUM 9.0 02/01/2023   PROT 6.2 (L) 02/01/2023   ALBUMIN 3.2 (L) 02/01/2023   BILITOT 0.8 02/01/2023   ALKPHOS 70 02/01/2023   AST 710 (H) 02/01/2023   ALT 294 (H) 02/01/2023   ANIONGAP 8 02/01/2023    CBG (last 3)  Recent Labs    01/30/23 0434  GLUCAP 177*       Coagulation Profile: Recent Labs  Lab 01/30/23 0653 01/31/23 0811  INR 1.4* 1.3*      Radiology Studies: No results found.     Kathlen Mody M.D. [facility] 02/01/2023, 12:14 PM  Available via Epic secure chat 7am-7pm After 7 pm, please refer to night coverage provider listed on amion.

## 2023-02-01 NOTE — Progress Notes (Signed)
Inpatient Rehab Coordinator Note:  I met with patient at bedside to discuss CIR recommendations and goals/expectations of CIR stay.  We reviewed 3 hrs/day of therapy, physician follow up, and average length of stay 2 weeks (dependent upon progress) with goals of supervision to mod I.  Pt confirms he lives with his cousin and her 3 young children (oldest is 6).  She works during the day but can work remotely per his report.  We discussed insurance and he confirms he is uninsured.  Will see about medicaid screening and I reviewed cost of care with him.  I will provide an estimate for him to review.  Will call his cousin and confirm caregiver support.    Estill Dooms, PT, DPT Admissions Coordinator [phone] 02/01/23  1:20 PM

## 2023-02-01 NOTE — TOC Initial Note (Signed)
Transition of Care [facility]) - Initial/Assessment Note    Patient Details  Name: Christopher Doyle MRN: [id number] Date of Birth: 08-Jan-2001  Transition of Care [facility]) CM/SW Contact:    Kermit Balo, RN Phone Number: 02/01/2023, 3:14 PM  Clinical Narrative:                 Per new therapy notes pt may be doing too well for CIR. Awaiting their decision. Pt states his cousin could assist in getting him to outpt or he could uber if needed. CM will also see if we can get him picked up for charity [facility] services.  Pt has PCP listed but he states he doesn't have a PCP. CM will have him an appointment prior to dc home.  Pt will need recommended DME. Will most likely need LOG for the DME. CM will follow up on this.  Pt has transportation home when medically ready.    Expected Discharge Plan: IP Rehab Facility Barriers to Discharge: Continued Medical Work up   Patient Goals and CMS Choice     Choice offered to / list presented to : Patient      Expected Discharge Plan and Services   Discharge Planning Services: CM Consult Post Acute Care Choice: IP Rehab Living arrangements for the past 2 months: Single Family Home                                      Prior Living Arrangements/Services Living arrangements for the past 2 months: Single Family Home Lives with:: Relatives Patient language and need for interpreter reviewed:: Yes Do you feel safe going back to the place where you live?: Yes        Care giver support system in place?: Yes (comment)   Criminal Activity/Legal Involvement Pertinent to Current Situation/Hospitalization: No - Comment as needed  Activities of Daily Living Home Assistive Devices/Equipment: None ADL Screening (condition at time of admission) Patient's cognitive ability adequate to safely complete daily activities?: Yes Is the patient deaf or have difficulty hearing?: Yes Does the patient have difficulty seeing, even when wearing glasses/contacts?: Yes Does  the patient have difficulty concentrating, remembering, or making decisions?: Yes Patient able to express need for assistance with ADLs?: Yes Does the patient have difficulty dressing or bathing?: No Independently performs ADLs?: Yes (appropriate for developmental age) Does the patient have difficulty walking or climbing stairs?: No Weakness of Legs: None Weakness of Arms/Hands: None  Permission Sought/Granted                  Emotional Assessment Appearance:: Appears stated age Attitude/Demeanor/Rapport: Engaged Affect (typically observed): Accepting Orientation: : Oriented to Self, Oriented to Place, Oriented to  Time, Oriented to Situation   Psych Involvement: No (comment)  Admission diagnosis:  Seizure (HCC) [R56.9] Transaminitis [R74.01] Polysubstance abuse (HCC) [F19.10] AKI (acute kidney injury) (HCC) [N17.9] Seizure-like activity (HCC) [R56.9] Opiate overdose, undetermined intent, initial encounter (HCC) [T40.604A] Altered mental status, unspecified altered mental status type [R41.82] Patient Active Problem List   Diagnosis Date Noted   Seizure (HCC) 01/30/2023   AKI (acute kidney injury) (HCC) 01/30/2023   Transaminitis 01/30/2023   MDD (major depressive disorder), recurrent severe, 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   DMDD (disruptive mood dysregulation disorder) (HCC) 06/21/2017   PCP:  Reather Converse, PA-C Pharmacy:   [facility] [phone] - [place], [place] - [address] [place] [address] Phone: [phone] Fax: [phone]     Social Determinants of Health (SDOH) Social History: SDOH Screenings   Food Insecurity: Food Insecurity Present (01/30/2023)  Housing: High Risk (01/30/2023)  Transportation Needs: Unmet Transportation Needs (01/30/2023)  Utilities: Not At Risk (01/30/2023)  Tobacco Use: Low Risk  (01/30/2023)   SDOH Interventions:     Readmission Risk  Interventions     No data to display
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
## 2023-02-02 LAB — VITAMIN B1: Vitamin B1 (Thiamine): 157.7 nmol/L (ref 66.5–200.0)

## 2023-02-02 NOTE — Progress Notes (Addendum)
Christopher Doyle left the floor for discharge Against Medical Advice.

## 2023-02-02 NOTE — Progress Notes (Signed)
Patient's cousin/ family friend Drucie Ip called to [employer] requesting information about patient and requesting to speak to a doctor about his cousin.  Explained to [place] that he is unable to receive information due to HIPAA regulations. Danelle Earthly became belligerent and requested to speak to the Press photographer.
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
## 2023-02-02 NOTE — Progress Notes (Signed)
Patient stating he is tired of being pricked for lab draws. Refusing to stay to complete IV fluids and remaining treatment course.  Signed Against Medical Advice form and placed in chart. Dr. Blake Divine notified of his leaving.

## 2023-02-08 NOTE — Discharge Summary (Signed)
Physician Discharge Summary   Patient: Christopher Doyle MRN: [id number] DOB: 06-08-2001  Admit date:     01/30/2023  Discharge date: 02/02/2023  Discharge Physician: Kathlen Mody   PCP: Reather Converse, PA-C    Pt left AMA.   Discharge Diagnoses: Principal Problem:   Seizure [facility]) Active Problems:   AKI (acute kidney injury) [facility])   Transaminitis    Hospital Course: Christopher Doyle is a 22 y.o. male with medical history significant of anxiety/depression presented with altered mentation.   Patient was found unresponsive, by his roommate this morning who called EMS.  After patient arrived in the ED, he was given 2 mg of IV Narcan initially, then patient responded breaking up and soon he went into a tonic-clonic seizure and patient was loaded with Keppra and Versed.   CT head negative for acute findings, CT abdomen pelvis showed periportal swelling of the liver suspicious for acute hepatitis. Blood work showed AKI creatinine 2.0, elevated AST and ALT and slight elevation of INR.  He was admitted for acute RHABDOMYOLYSIS AND AKI.   Assessment and Plan:  PATIENT LEFT AMA.     DISCHARGE MEDICATION: Allergies as of 02/02/2023       Reactions   Other Swelling   Peanut Butter throat and nasal swelling   Peanut Butter Flavor Swelling   Trouble breathing.   Peanut-containing Drug Products Swelling   Face/sinus        Medication List     ASK your doctor about these medications    ARIPiprazole 5 MG tablet Commonly known as: ABILIFY Take 1 tablet (5 mg total) by mouth daily.   benztropine 0.5 MG tablet Commonly known as: COGENTIN Take 1 tablet (0.5 mg total) by mouth 2 (two) times daily.   diphenhydrAMINE 25 mg capsule Commonly known as: BENADRYL Take 1 capsule (25 mg total) by mouth at bedtime as needed for sleep.        Follow-up Information     [facility] Follow up on 02/11/2023.   Specialty: Internal Medicine Why: Your appointment is at 1:20 pm.  Please arrive early and bring a picture ID and your current medications. Contact information: [address] [place] [address] [phone]        [facility] Follow up.   Why: Use this pharmacy for assistance with your medication cost Contact information: [address] [place], [place] [address]  Phone: [phone]               Discharge Exam: Christopher Doyle Weights   01/30/23 0423  Weight: 63.5 kg    The results of significant diagnostics from this hospitalization (including imaging, microbiology, ancillary and laboratory) are listed below for reference.   Imaging Studies: MR BRAIN W WO CONTRAST  Result Date: 01/30/2023 CLINICAL DATA:  Delirium. EXAM: MRI HEAD WITHOUT AND WITH CONTRAST TECHNIQUE: Multiplanar, multiecho pulse sequences of the brain and surrounding structures were obtained without and with intravenous contrast. CONTRAST:  6mL GADAVIST GADOBUTROL 1 MMOL/ML IV SOLN COMPARISON:  Head CT 01/30/2023. FINDINGS: Brain: No acute infarct or hemorrhage. No hydrocephalus or extra-axial collection. No foci of abnormal susceptibility. No mass or abnormal enhancement. Vascular: Normal flow voids and enhancement. Skull and upper cervical spine: Normal marrow signal and enhancement. Sinuses/Orbits: Unremarkable. Other: None. IMPRESSION: Normal brain MRI. Electronically Signed   By: Christopher Doyle M.D.   On: 01/30/2023 11:18   EEG adult  Result Date: 01/30/2023 Christopher Quest, MD  01/30/2023  8:51 AM Patient Name: Christopher Doyle MRN: [id number] Epilepsy Attending: Charlsie Doyle Referring Physician/Provider: Milon Dikes, MD Date: 01/30/2023 Duration: 30.52 mins Patient history: 22 year old presenting with obtundation, seizure-like activity following Narcan, now remaining persistently altered. EEG to evaluate for seizure Level of alertness: Awake, asleep AEDs during EEG study: LEV versed Technical aspects: This EEG study was done with scalp  electrodes positioned according to the 10-20 International system of electrode placement. Electrical activity was reviewed with band pass filter of 1-70Hz , sensitivity of 7 uV/mm, display speed of 10mm/sec with a 60Hz  notched filter applied as appropriate. EEG data were recorded continuously and digitally stored.  Video monitoring was available and reviewed as appropriate. Description: The posterior dominant rhythm consists of 9 Hz activity of moderate voltage (25-35 uV) seen predominantly in posterior head regions, symmetric and reactive to eye opening and eye closing. Sleep was characterized by vertex waves, sleep spindles (12 to 14 Hz), maximal frontocentral region. Photic driving was not seen during photic stimulation. Hyperventilation was not performed.   IMPRESSION: This study is within normal limits. No seizures or epileptiform discharges were seen throughout the recording. A normal interictal EEG does not exclude the diagnosis of epilepsy. Christopher Doyle   CT ABDOMEN PELVIS W CONTRAST  Result Date: 01/30/2023 CLINICAL DATA:  22 year old male with history of acute onset of abdominal pain. Abnormal liver function tests. EXAM: CT ABDOMEN AND PELVIS WITH CONTRAST TECHNIQUE: Multidetector CT imaging of the abdomen and pelvis was performed using the standard protocol following bolus administration of intravenous contrast. RADIATION DOSE REDUCTION: This exam was performed according to the departmental dose-optimization program which includes automated exposure control, adjustment of the mA and/or kV according to patient size and/or use of iterative reconstruction technique. CONTRAST:  75mL OMNIPAQUE IOHEXOL 350 MG/ML SOLN COMPARISON:  No priors. FINDINGS: Lower chest: Unremarkable. Hepatobiliary: Diffuse periportal edema in the liver. In segment 8 of the liver (axial image 10 of series 3 and coronal image 43 of series 6) there is a 1.5 x 1.6 x 1.3 cm lesion which is mixed attenuation, with internal areas of  low attenuation and apparent enhancement, considered indeterminate and incompletely characterized on today's CT examination. No intra or extrahepatic biliary ductal dilatation. Gallbladder is unremarkable in appearance. Pancreas: No pancreatic mass. No pancreatic ductal dilatation. No pancreatic or peripancreatic fluid collections or inflammatory changes. Spleen: Unremarkable. Adrenals/Urinary Tract: Bilateral kidneys and bilateral adrenal glands are normal in appearance. No hydroureteronephrosis. Urinary bladder is unremarkable in appearance. Stomach/Bowel: The appearance of the stomach is normal. No pathologic dilatation of small bowel or colon. Normal appendix. Vascular/Lymphatic: No significant atherosclerotic disease, aneurysm or dissection noted in the abdominal or pelvic vasculature. No lymphadenopathy noted in the abdomen or pelvis. Reproductive: Prostate gland and seminal vesicles are unremarkable in appearance. Other: No significant volume of ascites.  No pneumoperitoneum. Musculoskeletal: There are no aggressive appearing lytic or blastic lesions noted in the visualized portions of the skeleton. IMPRESSION: 1. Severe diffuse periportal edema in the liver. This is a nonspecific finding, but can be seen in the setting of acute hepatitis. 2. Indeterminate lesion in segment 8 of the liver. This is favored to represent a benign lesions such as a cavernous hemangioma, and would require follow-up nonemergent outpatient abdominal MRI with and without IV gadolinium for definitive characterization. Electronically Signed   By: Trudie Reed M.D.   On: 01/30/2023 06:34   DG Chest Portable 1 View  Result Date: 01/30/2023 CLINICAL DATA:  22 year old male with history of altered mental  status. EXAM: PORTABLE CHEST 1 VIEW COMPARISON:  No priors. FINDINGS: Lung volumes are normal. No consolidative airspace disease. No pleural effusions. No pneumothorax. No pulmonary nodule or mass noted. Pulmonary vasculature and  the cardiomediastinal silhouette are within normal limits. IMPRESSION: No radiographic evidence of acute cardiopulmonary disease. Electronically Signed   By: Trudie Reed M.D.   On: 01/30/2023 06:02   CT Head Wo Contrast  Result Date: 01/30/2023 CLINICAL DATA:  22 year old male with history of delirium. EXAM: CT HEAD WITHOUT CONTRAST TECHNIQUE: Contiguous axial images were obtained from the base of the skull through the vertex without intravenous contrast. RADIATION DOSE REDUCTION: This exam was performed according to the departmental dose-optimization program which includes automated exposure control, adjustment of the mA and/or kV according to patient size and/or use of iterative reconstruction technique. COMPARISON:  Head CT 03/01/2022. FINDINGS: Brain: No evidence of acute infarction, hemorrhage, hydrocephalus, extra-axial collection or mass lesion/mass effect. Vascular: No hyperdense vessel or unexpected calcification. Skull: Normal. Negative for fracture or focal lesion. Sinuses/Orbits: No acute finding. Mild multifocal mucosal thickening in the ethmoid sinuses bilaterally and maxillary sinuses bilaterally. Other: None. IMPRESSION: 1. No acute intracranial abnormalities. The appearance of the brain is normal. Electronically Signed   By: Trudie Reed M.D.   On: 01/30/2023 05:32    Microbiology: No results found for this or any previous visit.  Labs: CBC: No results for input(s): "WBC", "NEUTROABS", "HGB", "HCT", "MCV", "PLT" in the last 168 hours. Basic Metabolic Panel: No results for input(s): "NA", "K", "CL", "CO2", "GLUCOSE", "BUN", "CREATININE", "CALCIUM", "MG", "PHOS" in the last 168 hours. Liver Function Tests: No results for input(s): "AST", "ALT", "ALKPHOS", "BILITOT", "PROT", "ALBUMIN" in the last 168 hours. CBG: No results for input(s): "GLUCAP" in the last 168 hours.    Signed: Kathlen Mody, MD [facility]

## 2023-02-11 ENCOUNTER — Inpatient Hospital Stay: Payer: Self-pay | Admitting: Nurse Practitioner

## 2023-06-04 IMAGING — CT CT HEAD W/O CM
4 series · 17 of 47 positions shown, 19 images · non-contrast
Comparison: None Available.

CLINICAL DATA: Mental status change possible seizure



[Series 3: head without · axial · non-contrast · 0.44mm/px · z∈[-54,+66]mm · 7 of 34 slices shown, 9 images]
[im 5/34  brain]
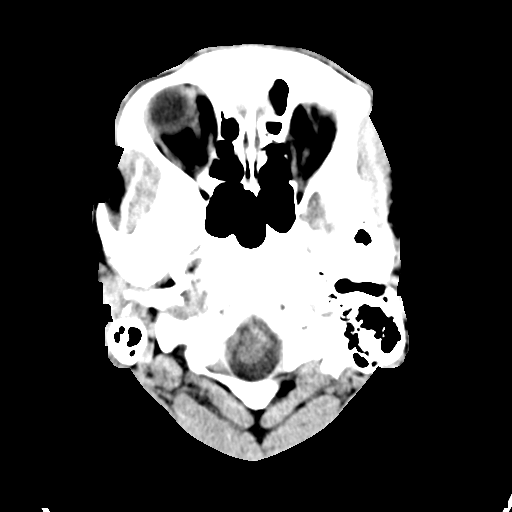
[im 5/34  bone]
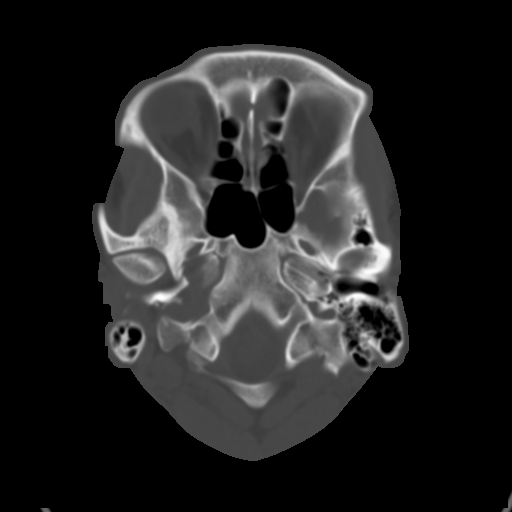
[im 9/34  brain]
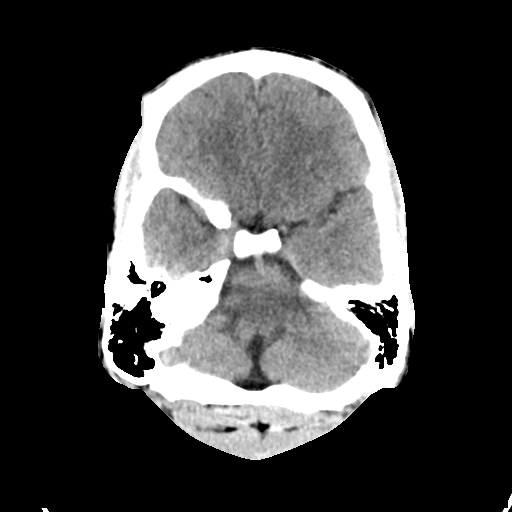
[im 13/34  brain]
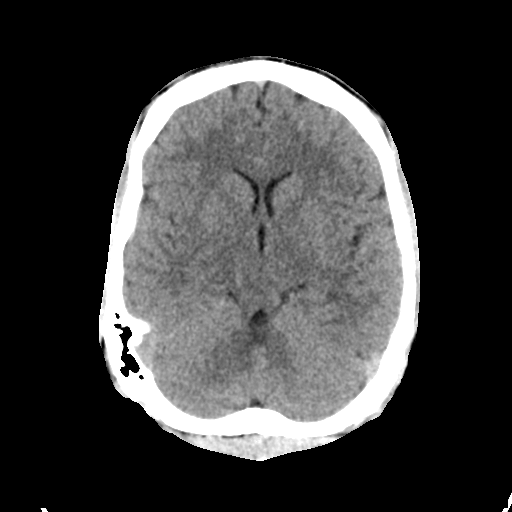
[im 17/34  brain]
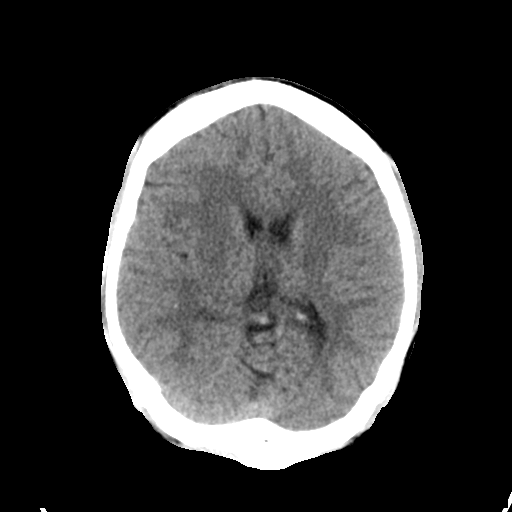
[im 21/34  brain]
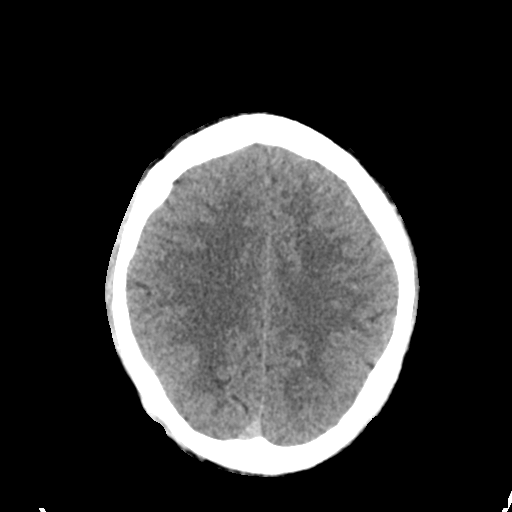
[im 21/34  bone]
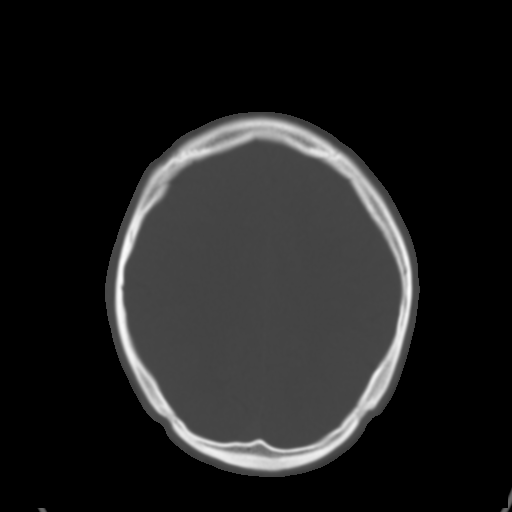
[im 25/34  brain]
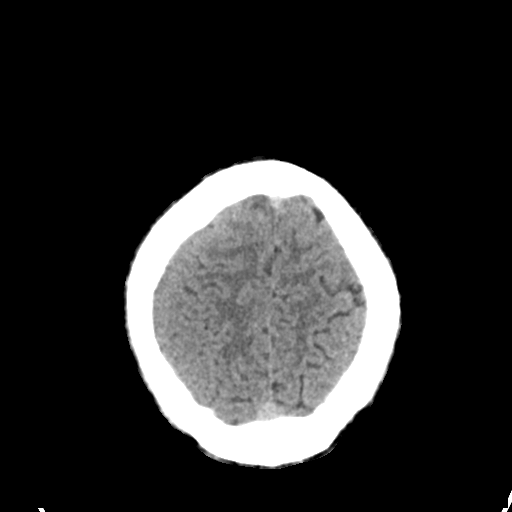
[im 29/34  brain]
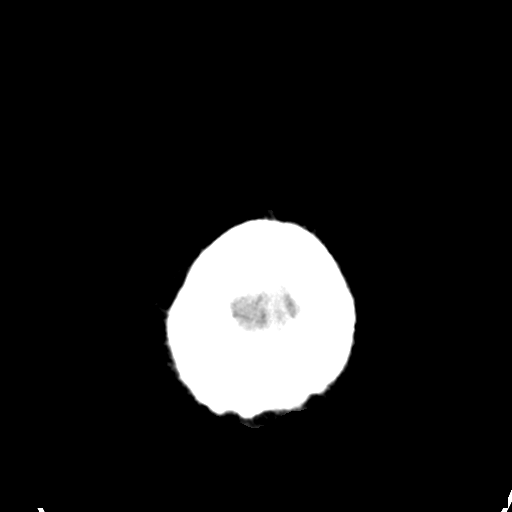

[Series 4: head bone · axial · 0.44mm/px · z∈[-58,-2]mm · 4 of 83 slices shown]
[im 9/83  bone]
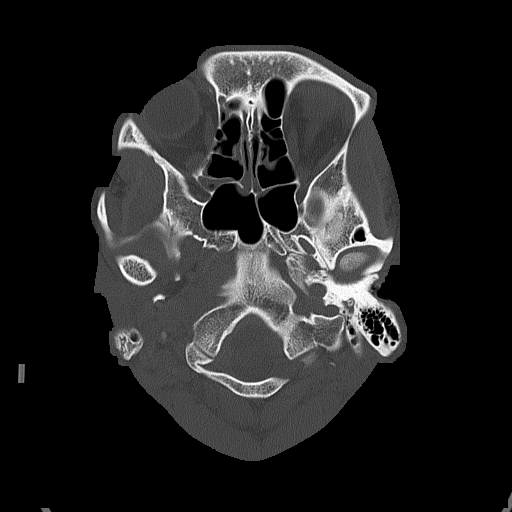
[im 17/83  bone]
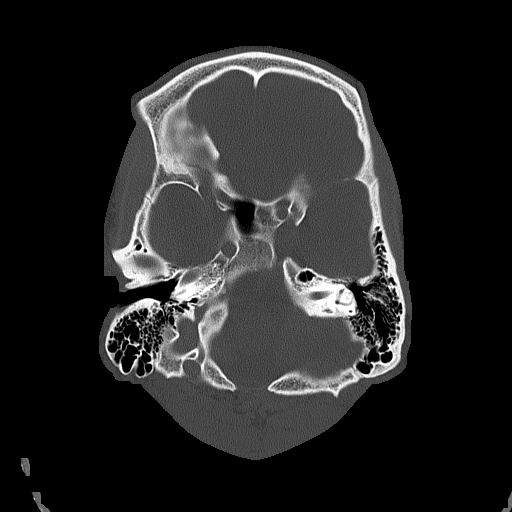
[im 25/83  bone]
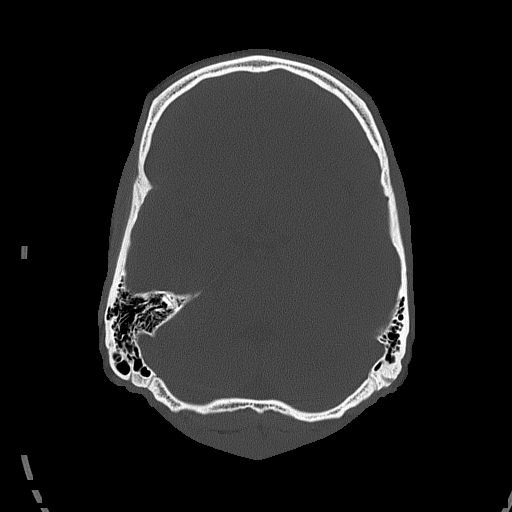
[im 37/83  bone]
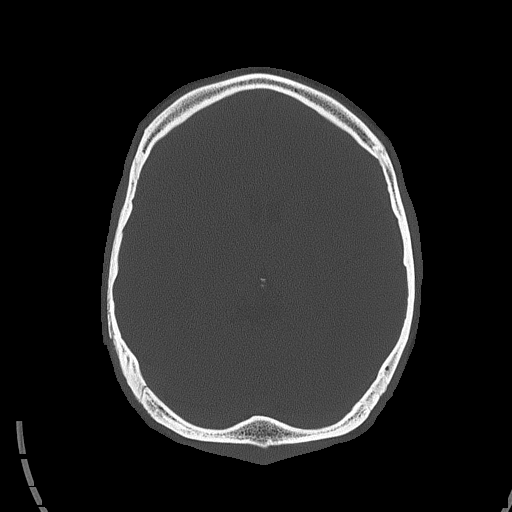

[Series 5: head without cor · coronal · non-contrast · 0.38mm/px · 3 of 70 slices shown]
[im 24/70  brain]
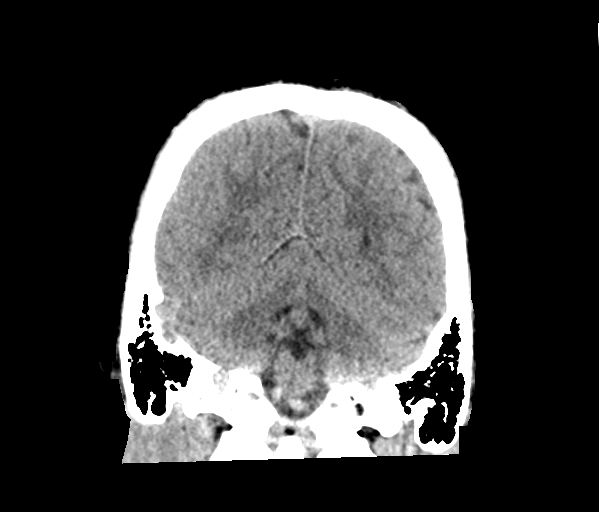
[im 31/70  brain]
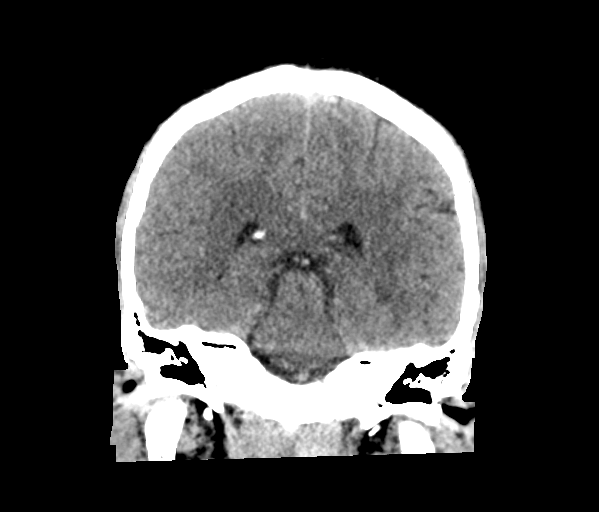
[im 39/70  brain]
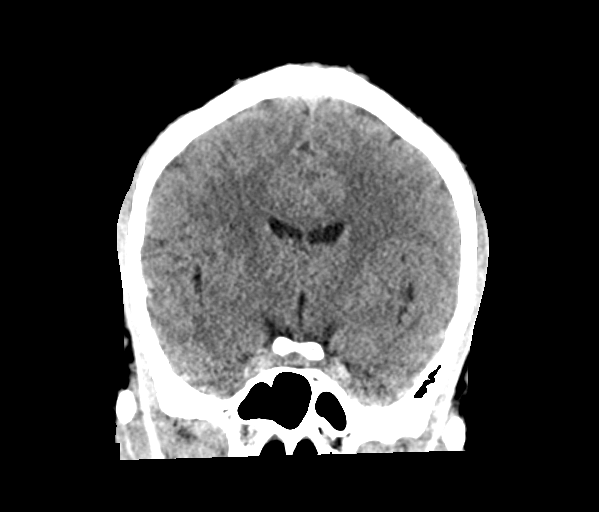

[Series 6: head without sag · sagittal · non-contrast · 0.39mm/px · 3 of 57 slices shown]
[im 19/57  brain]
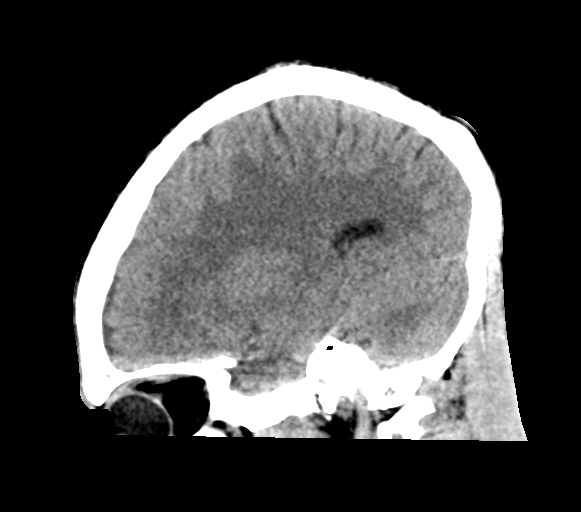
[im 29/57  brain]
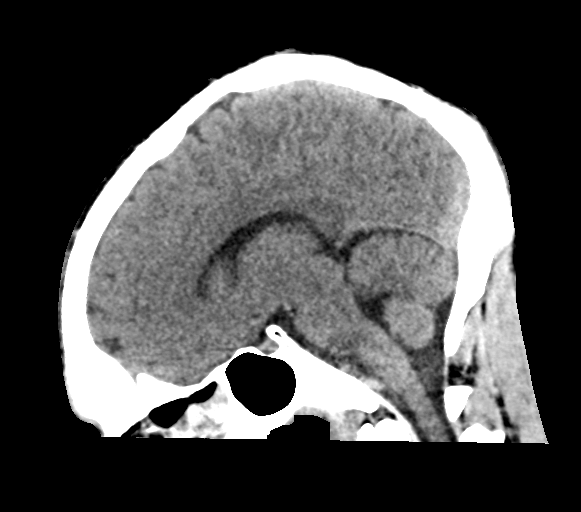
[im 38/57  brain]
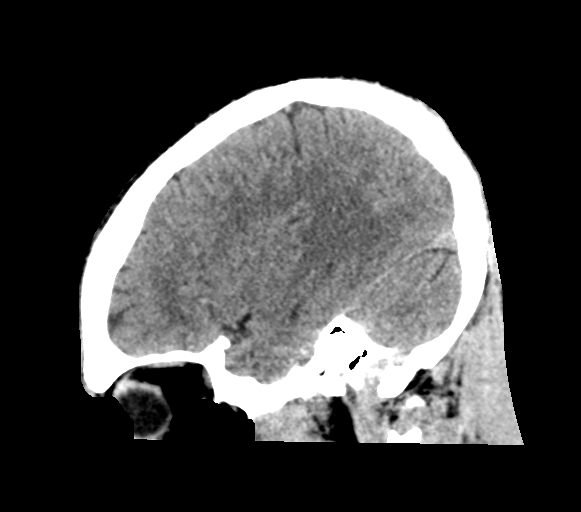

[17 of 47 positions shown; findings below may reference images not displayed]

FINDINGS: Brain: No evidence of acute infarction, hemorrhage, hydrocephalus,
extra-axial collection or mass lesion/mass effect.

Vascular: No hyperdense vessel or unexpected calcification.

Skull: Normal. Negative for fracture or focal lesion.

Sinuses/Orbits: Mucosal thickening in the sinuses

Other: None
IMPRESSION: Negative non contrasted CT appearance of the brain
